# Patient Record
Sex: Female | Born: 1935 | Race: White | Hispanic: No
Health system: Southern US, Community
[De-identification: ages and names within clinical notes are randomized; demographics above are authoritative.]

---

## 2012-06-20 ENCOUNTER — Ambulatory Visit: Payer: Self-pay | Admitting: Allergy

## 2012-10-11 ENCOUNTER — Ambulatory Visit: Payer: Self-pay | Admitting: Family Medicine

## 2014-03-14 IMAGING — MG MM CAD SCREENING MAMMO
1 series · 4 of 4 positions shown · non-contrast
Comparison: none

REASON FOR EXAM: SCR MAMMO NO ORDER
COMMENTS:

[R CC · right · 4 of 4 slices shown]
[im 1/4]
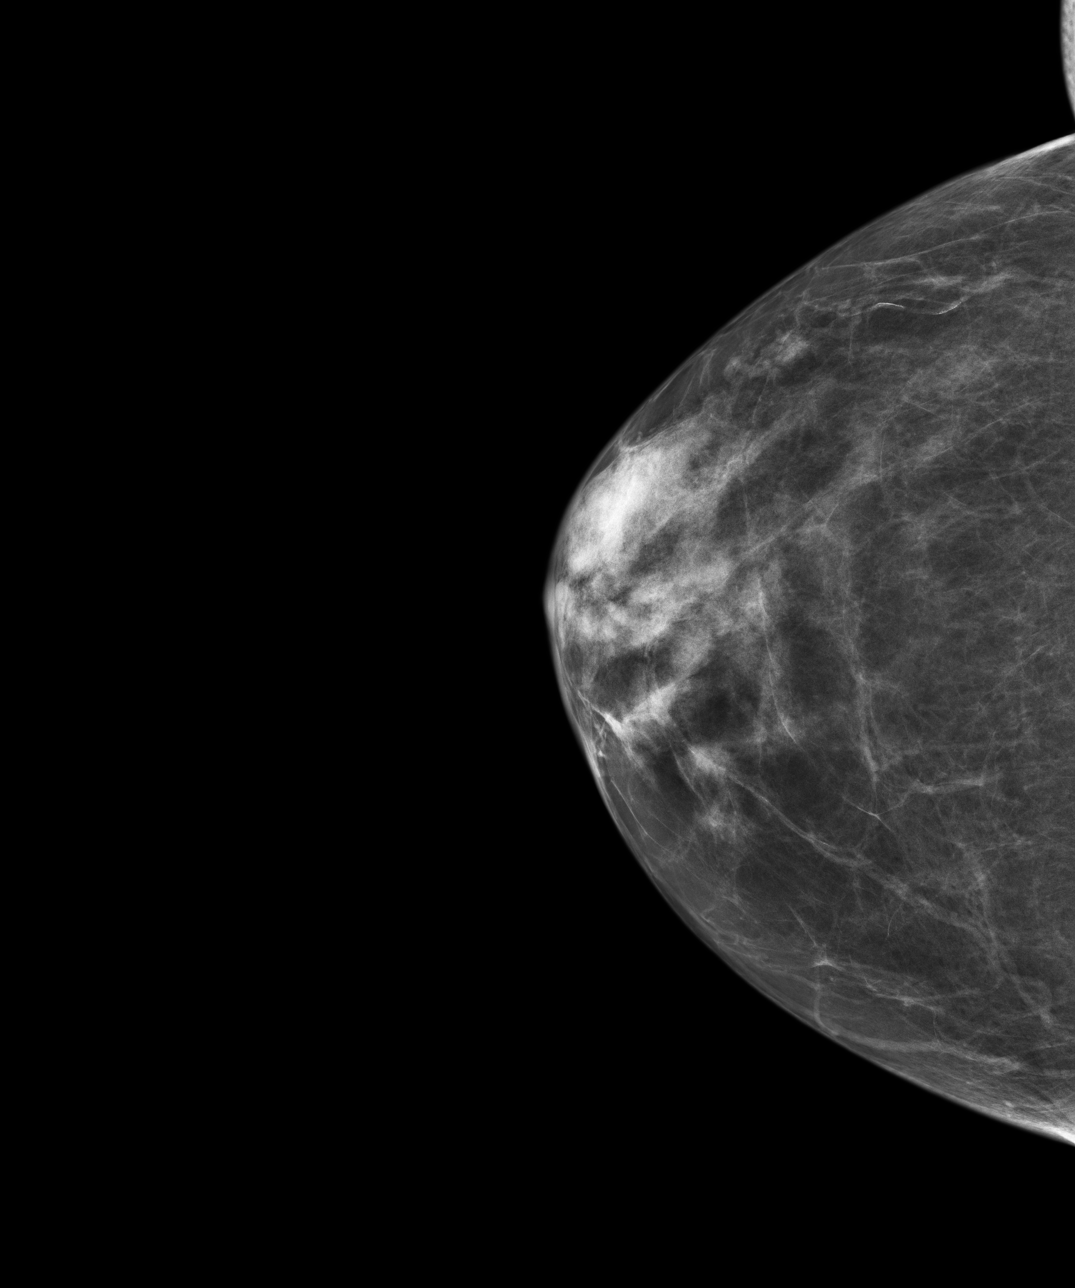
[im 2/4]
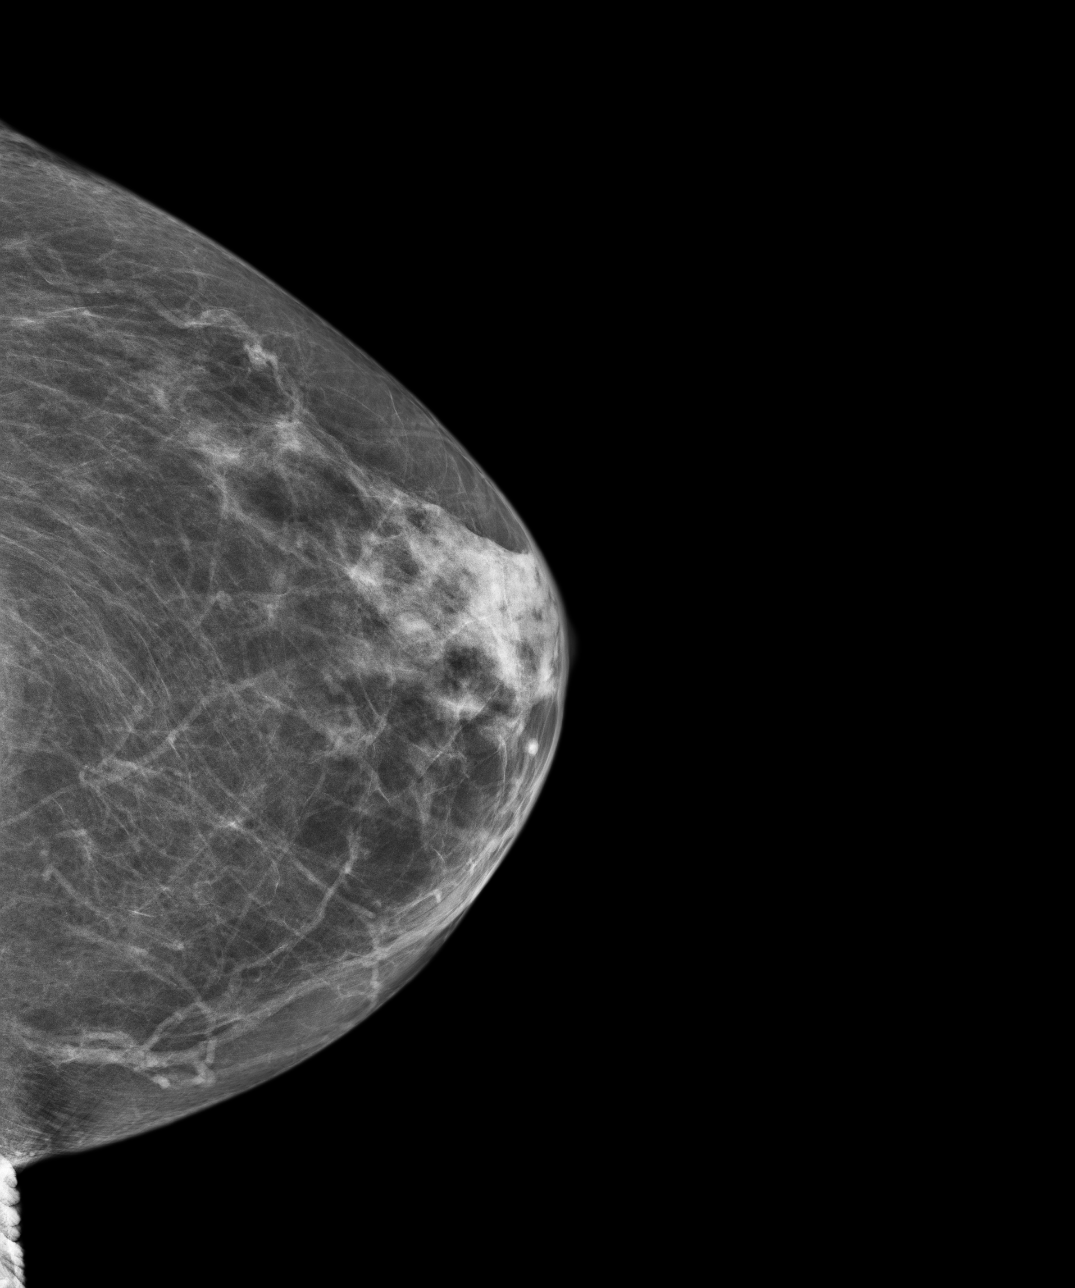
[im 3/4]
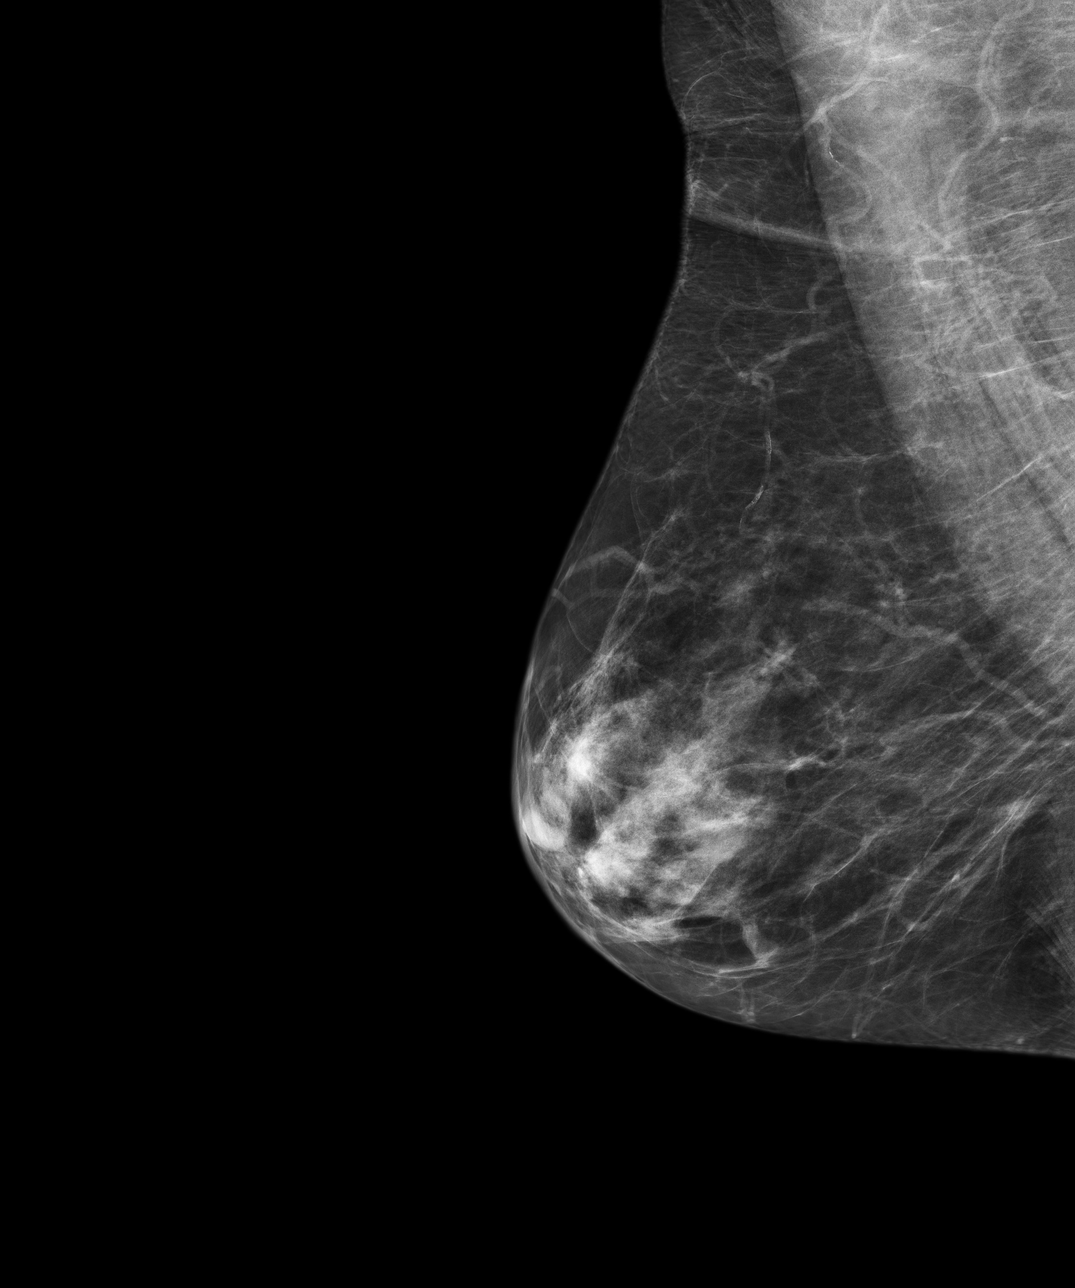
[im 4/4]
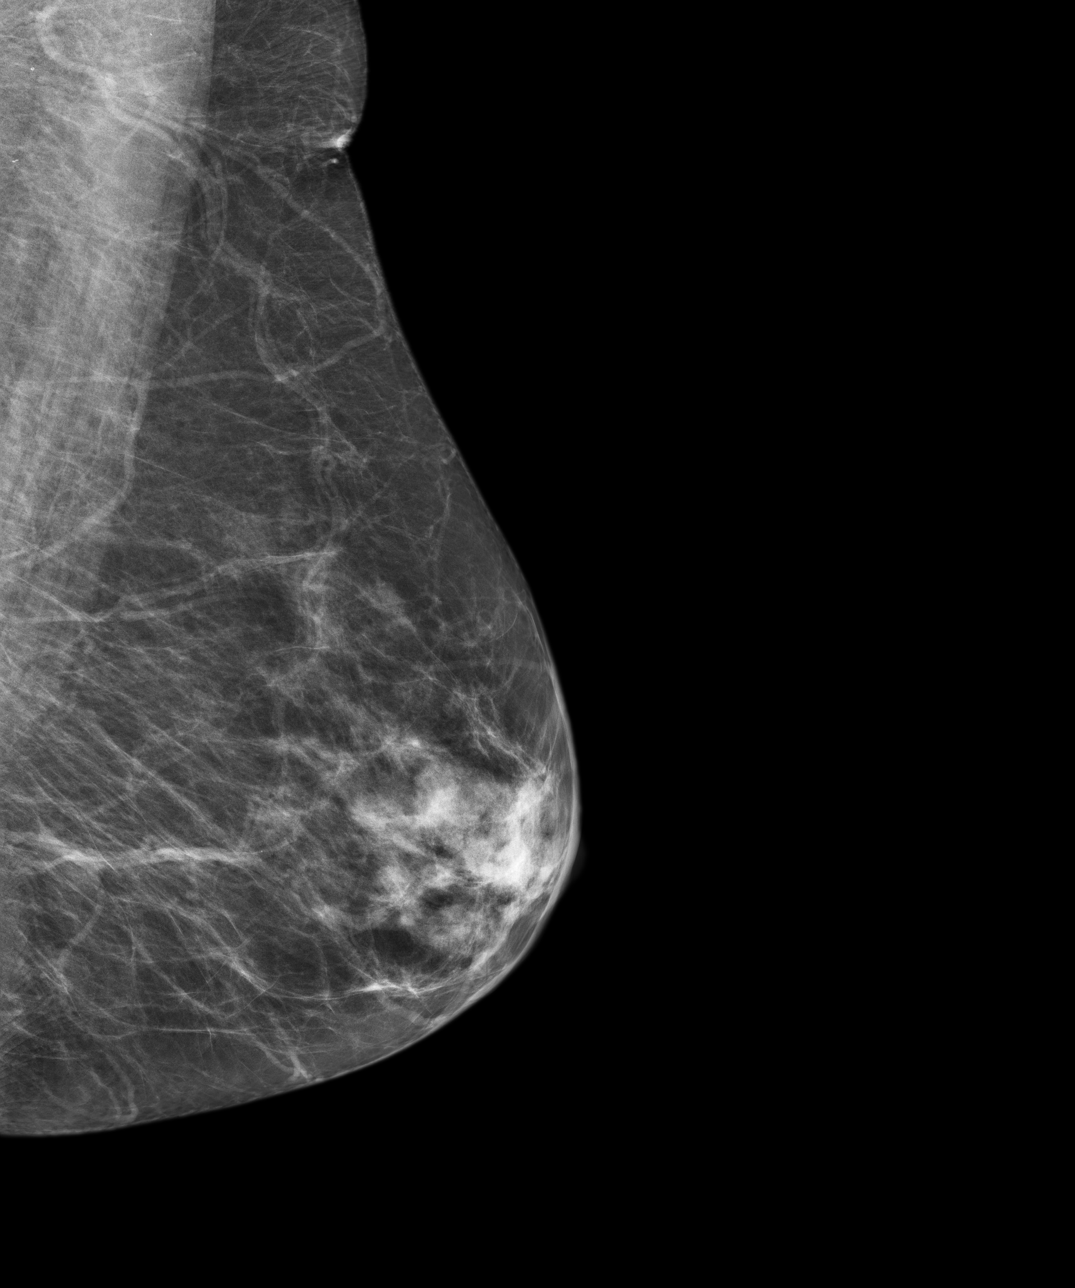

[4 of 4 positions shown; findings below may reference images not displayed]

PROCEDURE:     MAM - MAM DGTL SCRN MAM NO ORDER W/CAD  - October 11, 2012  [DATE]

RESULT:     There is no reported family history of breast cancer. The
patient denies previous breast surgery or cancer. Comparison is made to
previous digital exams including studies from 19 August, 2009 and 28 December, 2006. There is a moderate to dense parenchymal pattern in the
anterior portion of each breast which appears to have a stable mammographic
appearance. There is no developing parenchymal density, dominant mass,
malignant calcification or architectural distortion evident.
IMPRESSION: 1. Stable, benign appearing bilateral mammogram.

BI-RADS: Category 2 - Benign Finding.

Please continue to encourage annual mammographic follow-up and monthly
breast self exam.

A NEGATIVE MAMMOGRAM REPORT DOES NOT PRECLUDE BIOPSY OR OTHER EVALUATION OF
A CLINICALLY PALPABLE OR OTHERWISE SUSPICIOUS MASS OR LESION. BREAST CANCER
MAY NOT BE DETECTED BY MAMMOGRAPHY IN UP TO 10% OF CASES.

[REDACTED]

## 2014-05-08 ENCOUNTER — Ambulatory Visit: Payer: Self-pay | Admitting: Family Medicine

## 2014-08-15 ENCOUNTER — Inpatient Hospital Stay: Payer: Self-pay | Admitting: Specialist

## 2014-08-15 LAB — BASIC METABOLIC PANEL
Anion Gap: 11 (ref 7–16)
BUN: 18 mg/dL (ref 7–18)
CALCIUM: 9 mg/dL (ref 8.5–10.1)
Chloride: 103 mmol/L (ref 98–107)
Co2: 24 mmol/L (ref 21–32)
Creatinine: 0.49 mg/dL — ABNORMAL LOW (ref 0.60–1.30)
EGFR (African American): >60
EGFR (Non-African Amer.): >60
Glucose: 109 mg/dL — ABNORMAL HIGH (ref 65–99)
OSMOLALITY: 278 (ref 275–301)
Potassium: 3.8 mmol/L (ref 3.5–5.1)
Sodium: 138 mmol/L (ref 136–145)

## 2014-08-15 LAB — CBC
HCT: 46.4 % (ref 35.0–47.0)
HGB: 14.8 g/dL (ref 12.0–16.0)
MCH: 28.1 pg (ref 26.0–34.0)
MCHC: 31.9 g/dL — ABNORMAL LOW (ref 32.0–36.0)
MCV: 88 fL (ref 80–100)
Platelet: 304 10*3/uL (ref 150–440)
RBC: 5.27 10*6/uL — ABNORMAL HIGH (ref 3.80–5.20)
RDW: 13.8 % (ref 11.5–14.5)
WBC: 12.8 10*3/uL — ABNORMAL HIGH (ref 3.6–11.0)

## 2014-08-15 LAB — TROPONIN I: Troponin-I: 0.03 ng/mL

## 2014-08-16 LAB — CBC WITH DIFFERENTIAL/PLATELET
BASOS ABS: 0 10*3/uL (ref 0.0–0.1)
BASOS PCT: 0.2 %
Eosinophil #: 0 10*3/uL (ref 0.0–0.7)
Eosinophil %: 0 %
HCT: 45.7 % (ref 35.0–47.0)
HGB: 15 g/dL (ref 12.0–16.0)
Lymphocyte #: 0.7 10*3/uL — ABNORMAL LOW (ref 1.0–3.6)
Lymphocyte %: 18.6 %
MCH: 28.6 pg (ref 26.0–34.0)
MCHC: 32.8 g/dL (ref 32.0–36.0)
MCV: 87 fL (ref 80–100)
MONO ABS: 0.1 x10 3/mm — AB (ref 0.2–0.9)
Monocyte %: 2 %
NEUTROS ABS: 2.8 10*3/uL (ref 1.4–6.5)
Neutrophil %: 79.2 %
PLATELETS: 300 10*3/uL (ref 150–440)
RBC: 5.25 10*6/uL — AB (ref 3.80–5.20)
RDW: 13.7 % (ref 11.5–14.5)
WBC: 3.5 10*3/uL — ABNORMAL LOW (ref 3.6–11.0)

## 2014-08-16 LAB — BASIC METABOLIC PANEL
Anion Gap: 12 (ref 7–16)
BUN: 18 mg/dL (ref 7–18)
CALCIUM: 9.3 mg/dL (ref 8.5–10.1)
CHLORIDE: 102 mmol/L (ref 98–107)
CREATININE: 0.49 mg/dL — AB (ref 0.60–1.30)
Co2: 22 mmol/L (ref 21–32)
EGFR (African American): >60
EGFR (Non-African Amer.): >60
Glucose: 133 mg/dL — ABNORMAL HIGH (ref 65–99)
Osmolality: 276 (ref 275–301)
Potassium: 3.7 mmol/L (ref 3.5–5.1)
SODIUM: 136 mmol/L (ref 136–145)

## 2014-08-18 LAB — CBC WITH DIFFERENTIAL/PLATELET
BASOS ABS: 0 10*3/uL (ref 0.0–0.1)
BASOS PCT: 0.1 %
Eosinophil #: 0 10*3/uL (ref 0.0–0.7)
Eosinophil %: 0 %
HCT: 44.2 % (ref 35.0–47.0)
HGB: 14.3 g/dL (ref 12.0–16.0)
Lymphocyte #: 0.7 10*3/uL — ABNORMAL LOW (ref 1.0–3.6)
Lymphocyte %: 9.3 %
MCH: 28.2 pg (ref 26.0–34.0)
MCHC: 32.3 g/dL (ref 32.0–36.0)
MCV: 87 fL (ref 80–100)
MONO ABS: 0.6 x10 3/mm (ref 0.2–0.9)
MONOS PCT: 8.1 %
NEUTROS PCT: 82.5 %
Neutrophil #: 6.1 10*3/uL (ref 1.4–6.5)
Platelet: 295 10*3/uL (ref 150–440)
RBC: 5.05 10*6/uL (ref 3.80–5.20)
RDW: 13.7 % (ref 11.5–14.5)
WBC: 7.4 10*3/uL (ref 3.6–11.0)

## 2015-03-30 NOTE — Discharge Summary (Signed)
PATIENT NAME:  Caroline Alvarez, Caroline Alvarez MR#:  [id number] DATE OF BIRTH:  08-Feb-1935  DATE OF ADMISSION:  08/15/2014 DATE OF DISCHARGE:  08/19/2014  For a detailed note, please refer to history and physical done on admission.   DIAGNOSES AT DISCHARGE: As follows:  1.  Acute on chronic respiratory failure secondary to chronic obstructive pulmonary disease exacerbation.  2.  Chronic obstructive pulmonary disease exacerbation.  3.  Constipation.  4.  Tobacco abuse.   DIET: The patient is being discharged on a regular diet.   ACTIVITY: As tolerated.   FOLLOWUP:  Dr. Tera MaterJim Hedrick in the next 1-2 weeks.   DISCHARGE MEDICATIONS: Singulair 10 mg daily, albuterol inhaler 2 puffs every 4 hours as needed, Symbicort 160/4.5 two puffs b.i.d., Spiriva 1 puff daily, oxygen 2 liters by nasal cannula continuously, prednisone taper starting at 60 mg down to 10 mg over the next 12 days, nicotine patch 14 mg transdermal daily.   STUDIES DURING HOSPITAL COURSE:  A chest x-ray done on admission showing shallow inspiration with minimal left basilar atelectasis.   HOSPITAL COURSE: This is a 80 year old female who presented to the hospital with shortness of breath and was noted to be in acute on chronic respiratory failure secondary to COPD exacerbation.  1.  COPD exacerbation. This was likely the cause of the patient's acute on chronic respiratory failure. This is likely secondary to ongoing tobacco abuse. The patient was started on IV steroids, around-the-clock nebulizer treatments, maintained her on Symbicort. Spiriva was added. The patient was also given Singulair. After getting aggressive therapy for 48-72 hours the patient's wheezing and bronchospasm have significantly improved. She was ambulated on room air, she desaturate below 88%, therefore did qualify for home oxygen, which was arranged for her prior to discharge. At present, the patient will be discharged on oral prednisone taper along with Symbicort and Spiriva  and home oxygen as stated.  2.  Tobacco abuse. The patient was strongly advised to quit smoking. She was maintained on a nicotine patch. She is currently being discharged on that.  3.  Constipation. This resolved with lactulose. She has no acute abdominal pain.  CODE STATUS: The patient is a full code.   DISPOSITION: The patient is being discharged home.   Time spent on discharge: 40 minutes    ____________________________ Rolly PancakeVivek J. Cherlynn KaiserSainani, MD vjs:lt D: 08/19/2014 12:13:18 ET T: 08/19/2014 13:10:05 ET JOB#: [id number]  cc: Rolly PancakeVivek J. Cherlynn KaiserSainani, MD, <Dictator>             Rhona LeavensJames F. Burnett ShengHedrick, MD [place] SirenVIVEK J SAINANI MD ELECTRONICALLY SIGNED 08/27/2014 9:38

## 2015-03-30 NOTE — H&P (Signed)
PATIENT NAME:  Caroline Alvarez, Caroline Alvarez MR#:  [id number] DATE OF BIRTH:  1935/03/22  DATE OF ADMISSION:  08/15/2014  PRIMARY CARE PHYSICIAN: Rhona LeavensJames F. Burnett ShengHedrick, MD.   CHIEF COMPLAINT: Shortness of breath.   HISTORY OF PRESENT ILLNESS: This is a 80 year old female who presented to the hospital with progressive shortness of breath over the past few days, much worse than the past 2 days. The patient does have a history of ongoing tobacco abuse and a history of COPD. She does have an albuterol inhaler she uses at home and she has been trying to use it more frequently, but her shortness of breath has not improved. She has shortness of breath even on minimal exertion like putting her clothes, on a taking bath. Since her symptoms are not improving, she came to the ER for further evaluation. In the Emergency Room, the patient received some DuoNebs and also received IV steroids. Despite that, she continues to have significant bronchospasm, wheezing, and is short of breath. She was noted to be in COPD exacerbation and the hospitalist services were contacted for further treatment and evaluation.   REVIEW OF SYSTEMS: CONSTITUTIONAL: No documented fever. No weight gain. No weight loss.  EYES: No blurred or double vision.  ENT: No tinnitus. No postnasal drip. No redness of the oropharynx.  RESPIRATORY: Positive cough. Positive wheeze. No hemoptysis. Positive dyspnea. Positive COPD.   CARDIOVASCULAR: No chest pain. No orthopnea. No palpitations. No syncope.  GASTROINTESTINAL: No nausea, vomiting, diarrhea. No abdominal pain. No melena or hematochezia.  GENITOURINARY: No dysuria. No hematuria.  ENDOCRINE: No polyuria or nocturia. No heat or cold intolerance.  HEMATOLOGIC: No anemia, no bruising, no bleeding.  INTEGUMENTARY: No rashes. No lesions.  MUSCULOSKELETAL: No arthritis. No swelling. No gout.  NEUROLOGIC: No numbness, tingling. No ataxia. No seizure-type activity.  PSYCHIATRIC: No anxiety. No insomnia. No ADD.    PAST MEDICAL HISTORY: Consistent with COPD with ongoing tobacco abuse.   ALLERGIES: No known drug allergy.   SMOKING: Does smoke about 1/2 pack per day. Has been smoking for the past 50+ years. Occasional alcohol use. No illicit drug abuse. Lives with her niece.   FAMILY HISTORY: Mother and father are both deceased. Both died from complications of heart disease.   CURRENT MEDICATIONS: Singulair 10 mg daily, albuterol inhaler 2 puffs 4 times daily as needed, and Symbicort 160/4.5 two puffs b.i.d.   PHYSICAL EXAMINATION: Presently is as follows:  VITAL SIGNS: The patient is afebrile, pulse 122, respirations 32, blood pressure 147/81, saturation 95% on 2 liters nasal cannula.   GENERAL: She is a pleasant-appearing female, but in mild to moderate respiratory distress.  HEAD, EYES, EARS, NOSE AND THROAT: Atraumatic, normocephalic. Extraocular muscles are intact. Pupils equal and reactive to light. Sclerae anicteric. No conjunctival injection. No pharyngeal erythema.  NECK: Supple. There is no jugular venous distention. No bruits, no lymphadenopathy, no thyromegaly.  HEART: Tachycardic, regular, no murmurs, no rubs, no clicks.  LUNGS: She has positive use of accessory muscles, diffuse wheezing bilaterally. Prolonged inspiratory and expiratory phase. No dullness to percussion. No rhonchi. No rales.  ABDOMEN: Soft, flat, nontender, nondistended. Has good bowel sounds. No hepatosplenomegaly appreciated.  EXTREMITIES: No evidence of any cyanosis or clubbing. She does have trace pedal edema from the knees to the ankles bilaterally, +2 pedal and radial pulses bilaterally.  NEUROLOGIC: She is alert, awake, and oriented x 3 with no focal motor or sensory deficits appreciated bilaterally.  SKIN: Moist and warm with no rashes.  LYMPHATIC: There is no  cervical or axillary adenopathy.   LABORATORY: Serum glucose of 109, BUN 18, creatinine 0.49, sodium 138, potassium 3.8, chloride 103, bicarbonate 24,  troponin 0.03. White cell count 12.8, hemoglobin 14.8, hematocrit 46.4, platelet count 304,000. The patient did have a chest x-ray done which showed shallow inspiration with minimal left basilar atelectasis.   ASSESSMENT AND PLAN: This is a 80 year old female with a history of chronic obstructive pulmonary disease with ongoing tobacco abuse who presents to the hospital due to shortness of breath progressively getting worse, noted to be in chronic obstructive pulmonary disease exacerbation.  1.  Acute on chronic respiratory failure. This is likely secondary to the chronic obstructive pulmonary disease exacerbation. I will start the patient on IV steroids, around-the-clock nebulizer treatments, continue her Symbicort, add some Spiriva and some Pulmicort nebulizers, follow her clinically, assess her home oxygen prior to discharge.  2.  Chronic obstructive pulmonary disease exacerbation. This is likely secondary to ongoing tobacco abuse and possible underlying acute bronchitis. Chest x-ray negative for any acute pneumonia. Continue IV steroids, around-the-clock nebulizer treatments. Continue her Symbicort, add Spiriva, add Pulmicort nebulizers, assess her for home oxygen prior to discharge.  3.  Tobacco abuse. The patient was counseled on quitting smoking around 5 minutes. I offered nicotine patch, but the patient refused presently.   CODE STATUS: The patient is a full code.   TIME SPENT ON ADMISSION: 45 minutes.    ____________________________ Rolly Pancake. Cherlynn Kaiser, MD vjs:at D: 08/15/2014 18:31:47 ET T: 08/15/2014 18:42:01 ET JOB#: [id number]  cc: Rolly Pancake. Cherlynn Kaiser, MD, <Dictator> [place] Siren MD ELECTRONICALLY SIGNED 08/27/2014 9:38

## 2015-11-21 ENCOUNTER — Ambulatory Visit: Payer: Self-pay | Admitting: Urology

## 2016-01-16 IMAGING — CR DG CHEST 2V
1 series · 2 of 2 positions shown · non-contrast
Comparison: 06/20/2012

CLINICAL DATA: Shortness of breath, asthma.

EXAM:
CHEST  2 VIEW

[Series 1: dxr chest pa (or ap) and lateral · 0.14mm/px · 2 of 2 slices shown]
[im 1/2]
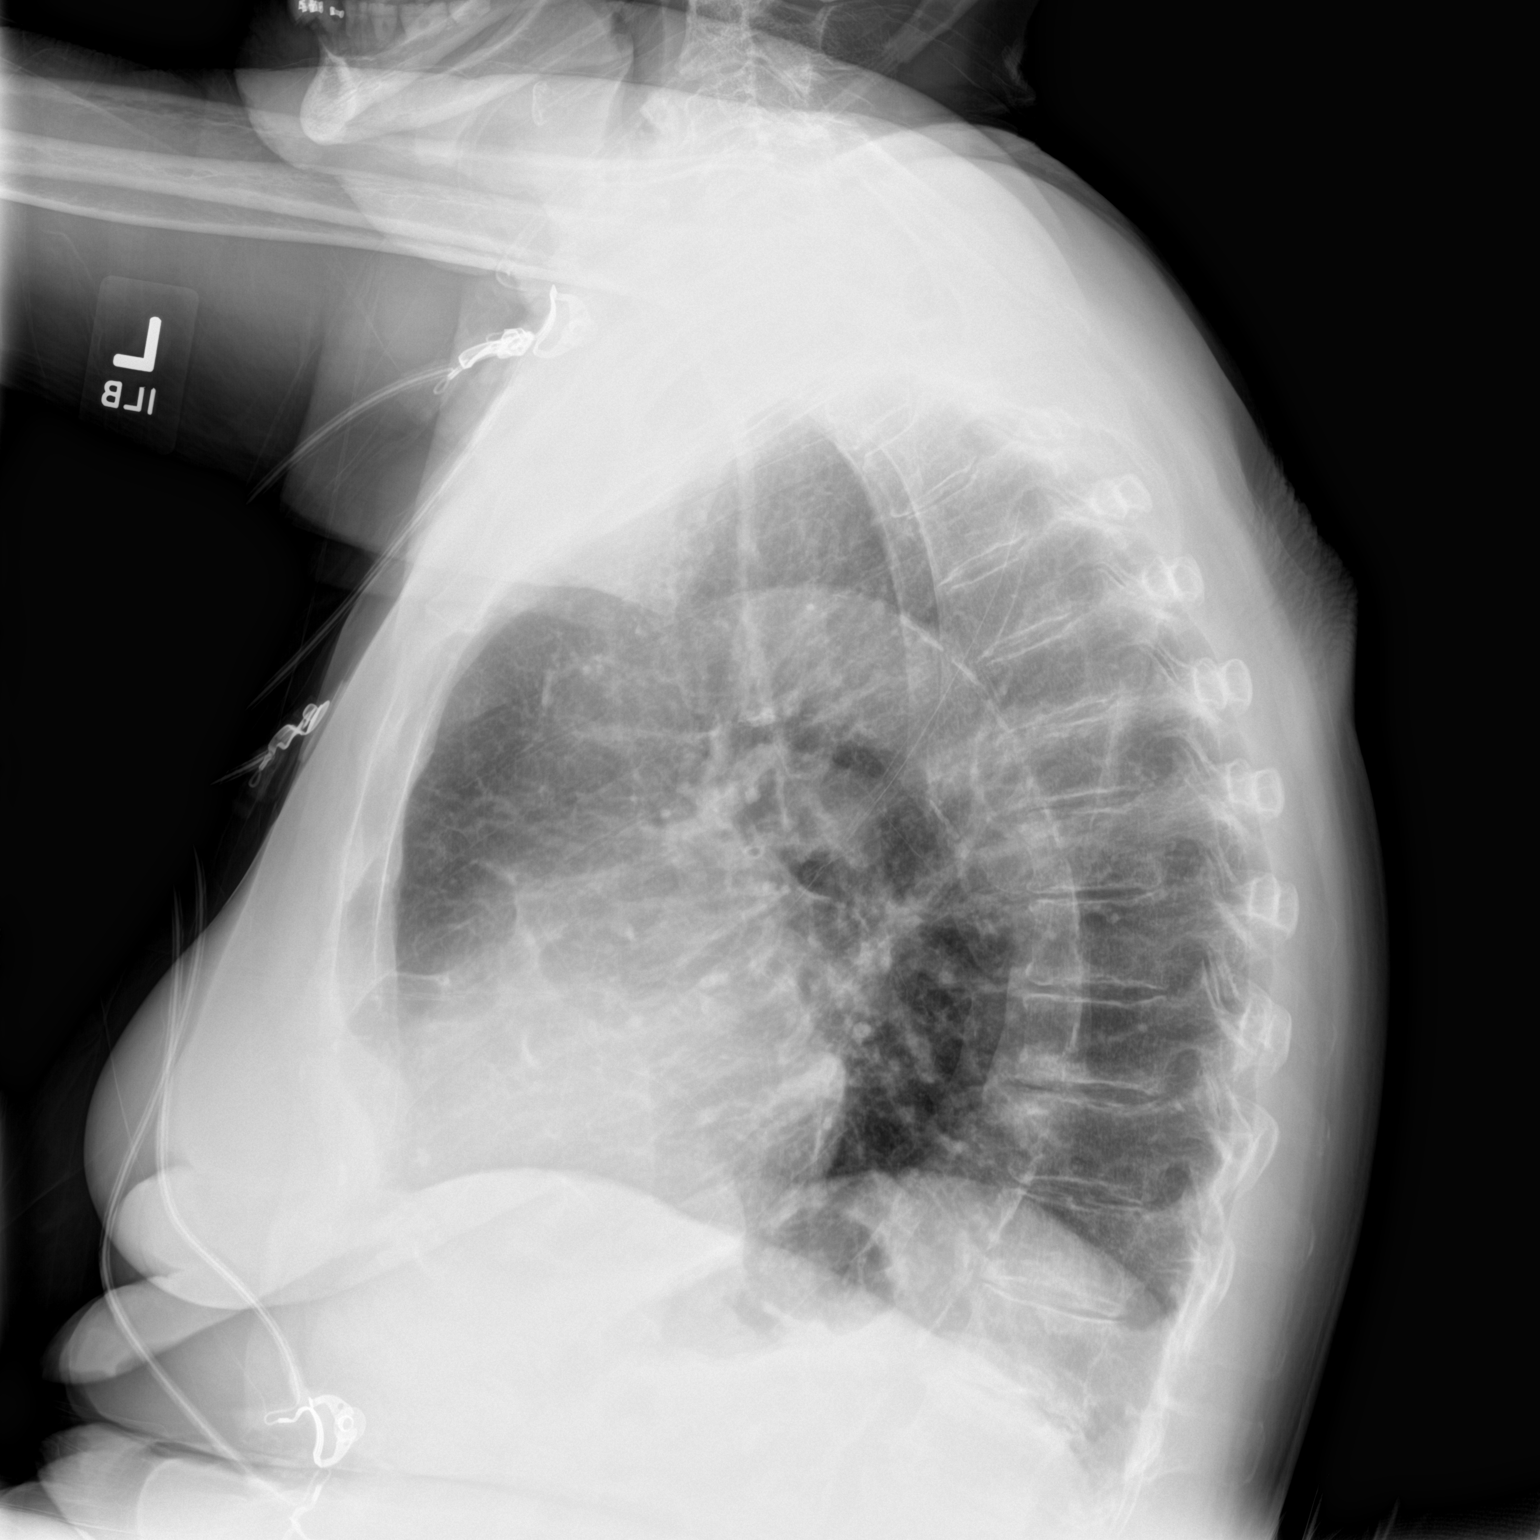
[im 2/2]
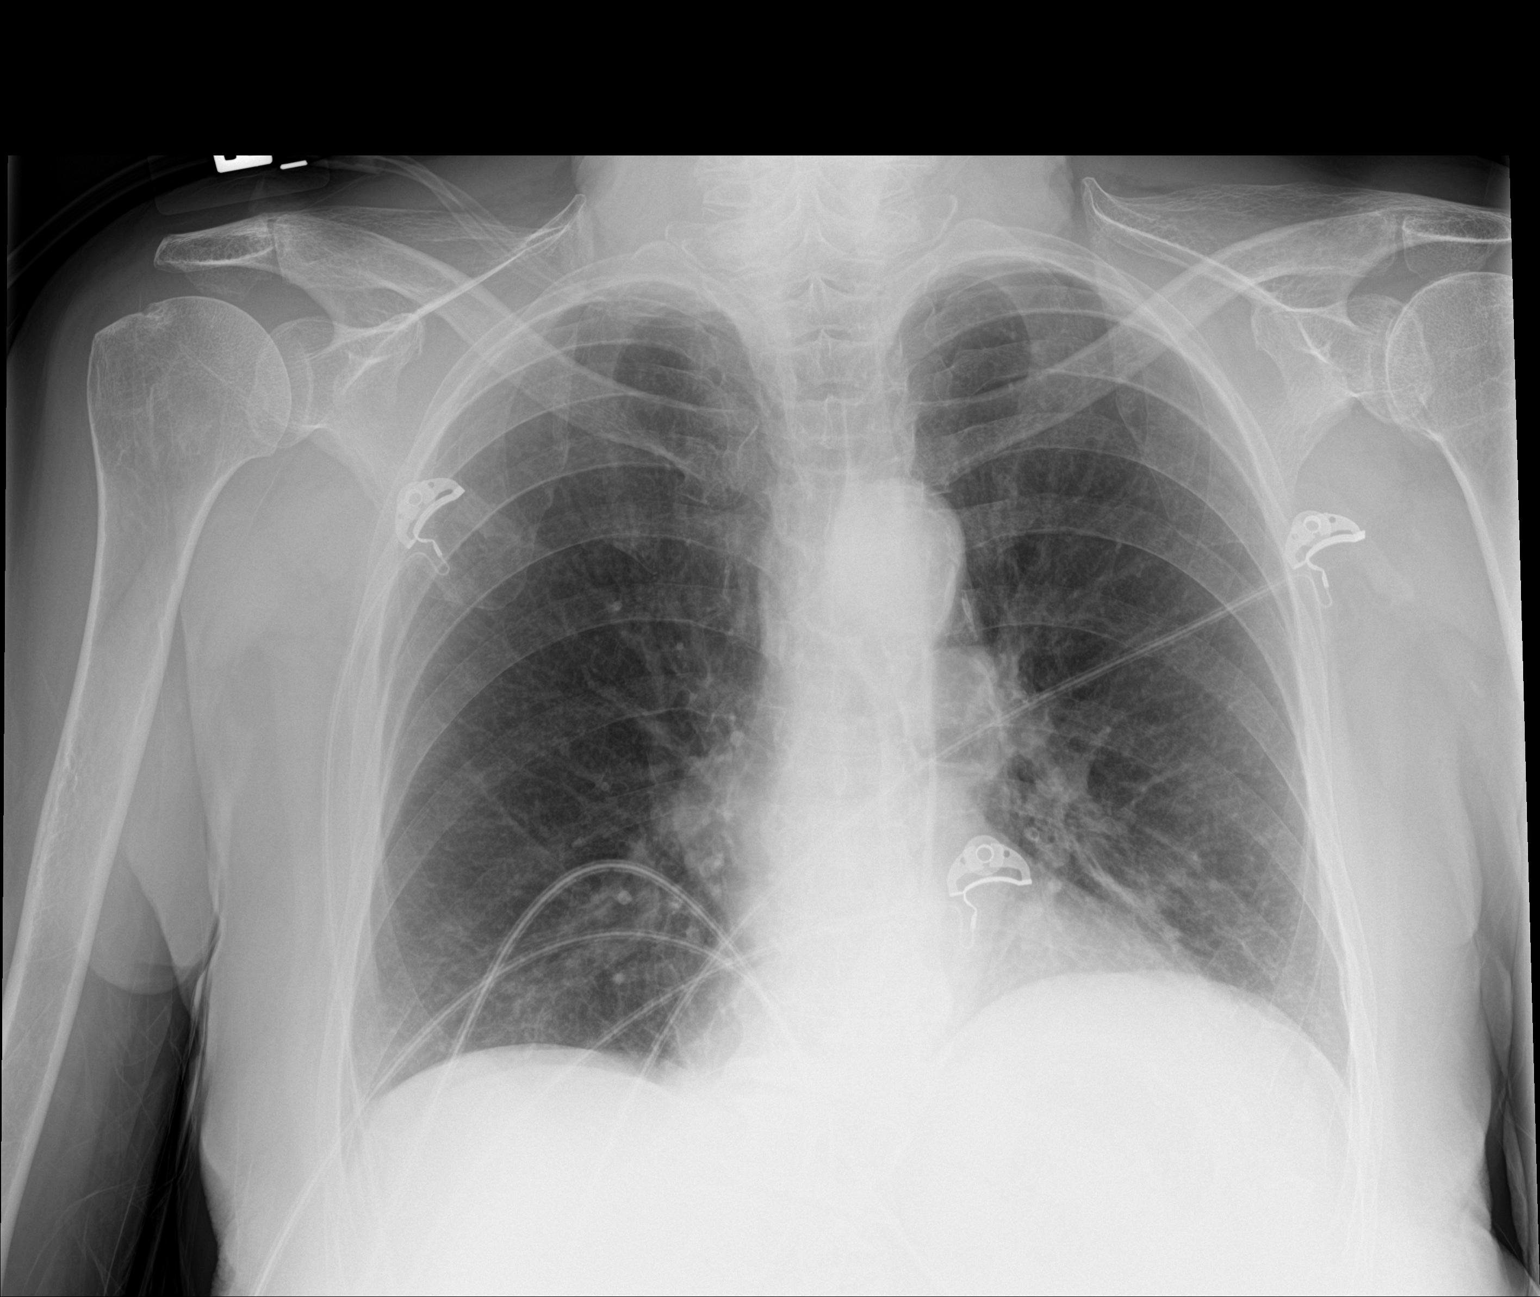

[2 of 2 positions shown; findings below may reference images not displayed]

FINDINGS: The cardiac silhouette is partially obscured but grossly unchanged
in size. Thoracic aortic calcification is noted. The lungs are
hypoinflated with mild elevation of left hemidiaphragm. Minimal left
basilar atelectasis is noted. Slight diffuse interstitial prominence
is similar to the prior study. No pleural effusion or pneumothorax
is identified. Thoracic spondylosis is noted.
IMPRESSION: Shallower inspiration with minimal left basilar atelectasis.
